# Patient Record
Sex: Female | Born: 2005 | Race: White | Hispanic: Yes | Marital: Single | Smoking: Never smoker
Health system: Southern US, Community
[De-identification: ages and names within clinical notes are randomized; demographics above are authoritative.]

---
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
## 2023-04-01 ENCOUNTER — Other Ambulatory Visit: Payer: Self-pay | Admitting: Physician Assistant

## 2023-04-01 DIAGNOSIS — N6311 Unspecified lump in the right breast, upper outer quadrant: Secondary | ICD-10-CM

## 2023-04-16 ENCOUNTER — Ambulatory Visit
Admission: RE | Admit: 2023-04-16 | Discharge: 2023-04-16 | Disposition: A | Discharge status: Home / Self Care | Payer: Medicaid Other | Source: Ambulatory Visit | Attending: Physician Assistant | Admitting: Physician Assistant

## 2023-04-16 DIAGNOSIS — N6311 Unspecified lump in the right breast, upper outer quadrant: Secondary | ICD-10-CM

## 2024-08-07 ENCOUNTER — Ambulatory Visit (HOSPITAL_COMMUNITY): Admission: EM | Admit: 2024-08-07 | Discharge: 2024-08-07 | Discharge status: Against Medical Advice

## 2024-08-10 ENCOUNTER — Ambulatory Visit (HOSPITAL_COMMUNITY)
Admission: EM | Admit: 2024-08-10 | Discharge: 2024-08-10 | Disposition: A | Discharge status: Home / Self Care | Attending: Emergency Medicine | Admitting: Emergency Medicine

## 2024-08-10 ENCOUNTER — Encounter (HOSPITAL_COMMUNITY): Payer: Self-pay | Admitting: Emergency Medicine

## 2024-08-10 ENCOUNTER — Ambulatory Visit (HOSPITAL_COMMUNITY): Payer: Self-pay

## 2024-08-10 DIAGNOSIS — R300 Dysuria: Secondary | ICD-10-CM | POA: Insufficient documentation

## 2024-08-10 DIAGNOSIS — Z3202 Encounter for pregnancy test, result negative: Secondary | ICD-10-CM | POA: Diagnosis not present

## 2024-08-10 DIAGNOSIS — N898 Other specified noninflammatory disorders of vagina: Secondary | ICD-10-CM | POA: Diagnosis not present

## 2024-08-10 LAB — POCT URINALYSIS DIP (MANUAL ENTRY)
Bilirubin, UA: NEGATIVE
Blood, UA: NEGATIVE
Glucose, UA: NEGATIVE mg/dL
Leukocytes, UA: NEGATIVE
Nitrite, UA: NEGATIVE
Protein Ur, POC: NEGATIVE mg/dL
Spec Grav, UA: 1.025 (ref 1.010–1.025)
Urobilinogen, UA: 0.2 U/dL
pH, UA: 7 (ref 5.0–8.0)

## 2024-08-10 LAB — POCT URINE PREGNANCY: Preg Test, Ur: NEGATIVE

## 2024-08-10 MED ORDER — FLUCONAZOLE 150 MG PO TABS: ORAL_TABLET | ORAL | 0 refills | Status: DC

## 2024-08-10 NOTE — ED Triage Notes (Signed)
 Pt c/o dysuria for 4 days. Reports vaginal itching as well. Denies any blood in urine.

## 2024-08-10 NOTE — Discharge Instructions (Signed)
 Take 1 tablet of Diflucan  today and another tablet in 3 days if your symptoms continue to cover for possible yeast infection. Your swab and urine culture results will return over the next few days and someone will call if results are positive and require any additional treatment. Otherwise follow-up with your primary care provider or return here as needed.

## 2024-08-10 NOTE — ED Provider Notes (Signed)
 [facility]    CSN: [id number] Arrival date & time: 08/10/24  1420      History   Chief Complaint Chief Complaint  Patient presents with   appt 230p    HPI Connie Mendoza is a 18 y.o. female.   Patient presents with dysuria that she describes as vaginal discomfort with urinating.  Patient reports that she is also had some vaginal itching.  Denies any abnormal vaginal discharge, abnormal vaginal bleeding, or vaginal lesions.  Denies hematuria, urinary frequency/urgency, abdominal pain, flank pain, and fever.  LMP 8/2.  Denies any known exposures to STDs.  The history is provided by the patient and medical records.    History reviewed. No pertinent past medical history.  Patient Active Problem List   Diagnosis Date Noted   Ingrown toenail 11/20/2019    History reviewed. No pertinent surgical history.  OB History     Gravida  0   Para      Term      Preterm      AB      Living         SAB      IAB      Ectopic      Multiple      Live Births               Home Medications    Prior to Admission medications   Medication Sig Start Date End Date Taking? Authorizing Provider  fluconazole  (DIFLUCAN ) 150 MG tablet Take one tablet today and one tablet in 3 days if symptoms persist. 08/10/24  Yes Johnie Flaming A, NP  amoxicillin  (AMOXIL ) 250 MG/5ML suspension Take 15 mLs (750 mg total) by mouth 2 (two) times daily. 750mg  po bid x 10 days qs Patient not taking: Reported on 01/07/2018 05/03/15   Rhae Lye, MD  ibuprofen  (ADVIL ,MOTRIN ) 100 MG/5ML suspension Take 5 mg/kg by mouth every 6 (six) hours as needed.    [provider]  Multiple Vitamin (MULTIVITAMIN) capsule Take 1 capsule by mouth daily.    [provider]    Family History No family history on file.  Social History Social History   Tobacco Use   Smoking status: Never   Smokeless tobacco: Never  Substance Use Topics   Alcohol use: No      Allergies   Patient has no known allergies.   Review of Systems Review of Systems  Per HPI  Physical Exam Triage Vital Signs ED Triage Vitals  Encounter Vitals Group     BP 08/10/24 1433 102/67     Girls Systolic BP Percentile --      Girls Diastolic BP Percentile --      Boys Systolic BP Percentile --      Boys Diastolic BP Percentile --      Pulse Rate 08/10/24 1433 77     Resp 08/10/24 1433 16     Temp 08/10/24 1433 98.4 F (36.9 C)     Temp Source 08/10/24 1433 Oral     SpO2 08/10/24 1433 99 %     Weight --      Height --      Head Circumference --      Peak Flow --      Pain Score 08/10/24 1432 0     Pain Loc --      Pain Education --      Exclude from Growth Chart --    No data found.  Updated Vital Signs  BP 102/67 (BP Location: Left Arm)   Pulse 77   Temp 98.4 F (36.9 C) (Oral)   Resp 16   LMP 07/18/2024 (Exact Date)   SpO2 99%   Visual Acuity Right Eye Distance:   Left Eye Distance:   Bilateral Distance:    Right Eye Near:   Left Eye Near:    Bilateral Near:     Physical Exam Vitals and nursing note reviewed.  Constitutional:      General: She is awake. She is not in acute distress.    Appearance: Normal appearance. She is well-developed and well-groomed. She is not ill-appearing.  Abdominal:     General: Abdomen is flat. Bowel sounds are normal. There is no distension.     Palpations: Abdomen is soft.     Tenderness: There is no abdominal tenderness. There is no right CVA tenderness, left CVA tenderness, guarding or rebound.  Genitourinary:    Comments: Exam deferred Skin:    General: Skin is warm and dry.  Neurological:     Mental Status: She is alert.  Psychiatric:        Behavior: Behavior is cooperative.      UC Treatments / Results  Labs (all labs ordered are listed, but only abnormal results are displayed) Labs Reviewed  POCT URINALYSIS DIP (MANUAL ENTRY) - Abnormal; Notable for the following components:       Result Value   Ketones, POC UA trace (5) (*)    All other components within normal limits  URINE CULTURE  POCT URINE PREGNANCY  CERVICOVAGINAL ANCILLARY ONLY    EKG   Radiology No results found.  Procedures Procedures (including critical care time)  Medications Ordered in UC Medications - No data to display  Initial Impression / Assessment and Plan / UC Course  I have reviewed the triage vital signs and the nursing notes.  Pertinent labs & imaging results that were available during my care of the patient were reviewed by me and considered in my medical decision making (see chart for details).     Patient is overall well-appearing.  Vitals are stable.  GU exam deferred.  Patient perform self swab for STD/STI.  HIV and RPR declined.  Urinalysis unremarkable, will send urine culture to confirm.  Urine pregnancy negative.  Prescribed Diflucan  for empirical treatment of yeast vaginitis due to vaginal itching.  Discussed follow-up and return precautions. Final Clinical Impressions(s) / UC Diagnoses   Final diagnoses:  Dysuria  Vaginal itching     Discharge Instructions      Take 1 tablet of Diflucan  today and another tablet in 3 days if your symptoms continue to cover for possible yeast infection. Your swab and urine culture results will return over the next few days and someone will call if results are positive and require any additional treatment. Otherwise follow-up with your primary care provider or return here as needed.   ED Prescriptions     Medication Sig Dispense Auth. Provider   fluconazole  (DIFLUCAN ) 150 MG tablet Take one tablet today and one tablet in 3 days if symptoms persist. 2 tablet Johnie Flaming A, NP      PDMP not reviewed this encounter.   Johnie Flaming A, NP 08/10/24 1453

## 2024-08-11 ENCOUNTER — Ambulatory Visit (HOSPITAL_COMMUNITY): Payer: Self-pay

## 2024-08-11 LAB — URINE CULTURE: Culture: NO GROWTH

## 2024-08-11 LAB — CERVICOVAGINAL ANCILLARY ONLY
Bacterial Vaginitis (gardnerella): POSITIVE — AB
Candida Glabrata: NEGATIVE
Candida Vaginitis: NEGATIVE
Chlamydia: NEGATIVE
Comment: NEGATIVE
Comment: NEGATIVE
Comment: NEGATIVE
Comment: NEGATIVE
Comment: NEGATIVE
Comment: NORMAL
Neisseria Gonorrhea: NEGATIVE
Trichomonas: NEGATIVE

## 2024-08-11 MED ORDER — METRONIDAZOLE 500 MG PO TABS: 500.0000 mg | ORAL_TABLET | Freq: Two times a day (BID) | ORAL | 0 refills | Status: DC

## 2024-08-26 NOTE — Progress Notes (Signed)
 Subjective  HPI  Here for acute care with c/o rash.  Chief Complaint  Patient presents with  . Rash    Patient reports that rash on lower abdomen and upper back. The rash has now started at the area of her arm meeting her torso. Has been using Eucerin cream but has not noticed a difference wince using. Patient stated this rash occurred while on vacation 2 weeks ago when her and her family were staying at a [facility] [place] [facility]. No pain reports just itching   Is the rash still there: yes. Rash : started 2 weeks ago. This is 3rd week. Initial rash: a lot pimple like lesions on the abdomen: looked like pimple. Red spots and itchy.  The upper back broke out: few days after. The rash has gotten worse: starting to get them on at the junction of chest/shoulder: this came on: last week. Also has 1-2 spots on thighs: this came on: last week. Applied Aveno Cream. Not applied any Eucerin. No OTC HC.  Family and her were staying at an [facility] when rash appeared. Not taken any Benadryl either. No one else in the family has the same issues. Sister who slept on the same bed: has no issues.  No known allergies to anything. Does not recall eating anything that brought on the rash. Wonders if the laundry detergent that the sheets were cleaned out.  No trouble breathing. No fevers  No regular medicines but then notes Vitamins: MVT: same one the has taken for a long time.  Patient Active Problem List   Diagnosis Date Noted  . Iron deficiency 03/30/2024  . Headache 09/14/2012    Past Medical History:  Diagnosis Date  . 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  Tobacco History   Tobacco Use  Smoking Status Never  Smokeless Tobacco Never   Social History   Substance and Sexual Activity  Alcohol Use Never   Social History   Substance and Sexual Activity  Drug Use Never   Social History   Substance and Sexual Activity  Sexual Activity Never     03/29/2023  1:44  PM  What is your living situation today?  I have a steady place to live  Think about the place you live. Do you have problems with any of the following?  None of the above  Number of positive responses to housing questions 0  Within the past 12 months, you worried that your food would run out before you got money to buy more. Never true  Within the past 12 months, the food you bought just didn't last and you didn't have money to get more. Never true  Number of positive responses to food security questions 0  In the past 12 months, has lack of transportation kept you from medical appointments, meetings, work or from getting things needed for daily living? No  In the past 12 months has the electric, gas, oil, or water company threatened to shut off services in your home? No    No family history on file.   Review of Systems  Objective   Vitals:   08/26/24 1514  BP: 101/64  BP Site: Right Arm  BP Position: Sitting  BP Cuff Size: Regular Adult  Pulse: 78  Temp: 97.7 F (36.5 C)  TempSrc: Oral  Weight: 118 lb 9.6 oz (53.8 kg)  Height: 5' 1 (1.549 m)   Estimated body mass index is 22.41 kg/m as calculated from  the following:   Height as of this encounter: 5' 1 (1.549 m).   Weight as of this encounter: 118 lb 9.6 oz (53.8 kg). Facility age limit for growth %iles is 20 years.   Physical Exam  Alert, normal mentation and interaction. No visible distress. Not ill appearing Skin:  Scattered areas of skin colored follicular sized lesions on abdomen, back, close to the axilla bilaterally. Area on Rt side of abdomen: skin colored elliptical shaped, slightly raised lesions about 5 of them, somewhat linear . Not vesicular. No lip swelling.  Breathing: not labored   Assessment and Plan  1. Dermatitis (Primary) Not sure what caused it.  Perhaps an allergic reaction to something, perhaps heat. Not bed bug bites. Sister who slept in same room is fine. Pt to watch triggers:  foods/drinks/topical stuff.  IF continues to have problem and can't figure out cause: can give referral to Allergist for testing. She can apply the TAC cream if Prednisone does not solve the issue. Dont think it is a IgE mediated reaction at this time so did not do a taper over 2 weeks or so. Will see. See AVS for more advise given.  - predniSONE (DELTASONE) 20 mg tablet; Take 1 Tablet by mouth once daily With food.  Dispense: 5 Tablet; Refill: 0  - triamcinolone (KENALOG) 0.1 % cream; Apply topically 2 (two) times daily Apply only very thin smear to the affected areas twice a day for 2 weeks only and then as needed. Not for eyes or genital areas..  Dispense: 15 g; Refill: 0  Rtc prn

## 2024-11-17 ENCOUNTER — Ambulatory Visit
Admission: EM | Admit: 2024-11-17 | Discharge: 2024-11-17 | Disposition: A | Discharge status: Home / Self Care | Attending: Student | Admitting: Student

## 2024-11-17 ENCOUNTER — Encounter: Payer: Self-pay | Admitting: Emergency Medicine

## 2024-11-17 DIAGNOSIS — N760 Acute vaginitis: Secondary | ICD-10-CM | POA: Insufficient documentation

## 2024-11-17 LAB — POCT URINE DIPSTICK
Bilirubin, UA: NEGATIVE
Blood, UA: NEGATIVE
Glucose, UA: NEGATIVE mg/dL
Ketones, POC UA: NEGATIVE mg/dL
Leukocytes, UA: NEGATIVE
Nitrite, UA: NEGATIVE
POC PROTEIN,UA: NEGATIVE
Spec Grav, UA: >=1.03 — AB (ref 1.010–1.025)
Urobilinogen, UA: 0.2 U/dL
pH, UA: 6 (ref 5.0–8.0)

## 2024-11-17 LAB — POCT URINE PREGNANCY: Preg Test, Ur: NEGATIVE

## 2024-11-17 MED ORDER — FLUCONAZOLE 150 MG PO TABS: ORAL_TABLET | ORAL | 0 refills | Status: AC

## 2024-11-17 MED ORDER — METRONIDAZOLE 500 MG PO TABS: 500.0000 mg | ORAL_TABLET | Freq: Two times a day (BID) | ORAL | 0 refills | Status: AC

## 2024-11-17 NOTE — ED Triage Notes (Signed)
 Pt c/o vaginal burning when urinating and yellowish discharge for 2 days

## 2024-11-17 NOTE — ED Provider Notes (Signed)
 [facility]    CSN: [id number] Arrival date & time: 11/17/24  1240      History   Chief Complaint Chief Complaint  Patient presents with   Vaginal Discharge    HPI Connie Mendoza is a 18 y.o. female presenting with vaginitis. Pt c/o vaginal burning when urinating and yellowish discharge for 2 days. Denies abd pain, CVAT, fevers. Denies new partners. Last BV was 07/2024; she was treated with metronidazole  and flagyl  at that time, which she completed as directed.  HPI  History reviewed. No pertinent past medical history.  Patient Active Problem List   Diagnosis Date Noted   Ingrown toenail 11/20/2019    History reviewed. No pertinent surgical history.  OB History     Gravida  0   Para      Term      Preterm      AB      Living         SAB      IAB      Ectopic      Multiple      Live Births               Home Medications    Prior to Admission medications   Medication Sig Start Date End Date Taking? Authorizing Provider  fluconazole  (DIFLUCAN ) 150 MG tablet If you develop vaginal yeast infection, take one tablet. If symptoms persist, take the second tablet 3 days later. 11/17/24   Amous Crewe E, PA-C  ibuprofen  (ADVIL ,MOTRIN ) 100 MG/5ML suspension Take 5 mg/kg by mouth every 6 (six) hours as needed.    [provider]  metroNIDAZOLE  (FLAGYL ) 500 MG tablet Take 1 tablet (500 mg total) by mouth 2 (two) times daily. 11/17/24   Gizzelle Lacomb E, PA-C  Multiple Vitamin (MULTIVITAMIN) capsule Take 1 capsule by mouth daily.    [provider]    Family History History reviewed. No pertinent family history.  Social History Social History   Tobacco Use   Smoking status: Never   Smokeless tobacco: Never  Substance Use Topics   Alcohol use: No     Allergies   Patient has no known allergies.   Review of Systems Review of Systems  Constitutional:  Negative for chills and fever.  HENT:  Negative for sore  throat.   Eyes:  Negative for pain and redness.  Respiratory:  Negative for shortness of breath.   Cardiovascular:  Negative for chest pain.  Gastrointestinal:  Negative for abdominal pain, diarrhea, nausea and vomiting.  Genitourinary:  Positive for vaginal discharge. Negative for decreased urine volume, difficulty urinating, dysuria, flank pain, frequency, genital sores, hematuria and urgency.  Musculoskeletal:  Negative for back pain.  Skin:  Negative for rash.     Physical Exam Triage Vital Signs ED Triage Vitals  Encounter Vitals Group     BP      Girls Systolic BP Percentile      Girls Diastolic BP Percentile      Boys Systolic BP Percentile      Boys Diastolic BP Percentile      Pulse      Resp      Temp      Temp src      SpO2      Weight      Height      Head Circumference      Peak Flow      Pain Score      Pain Loc  Pain Education      Exclude from Growth Chart    No data found.  Updated Vital Signs BP 107/68 (BP Location: Right Arm)   Pulse 73   Temp 98 F (36.7 C) (Oral)   Resp 16   LMP 10/19/2024 (Approximate)   SpO2 98%   Visual Acuity Right Eye Distance:   Left Eye Distance:   Bilateral Distance:    Right Eye Near:   Left Eye Near:    Bilateral Near:     Physical Exam Vitals reviewed.  Constitutional:      General: She is not in acute distress.    Appearance: Normal appearance. She is not ill-appearing.  HENT:     Head: Normocephalic and atraumatic.     Mouth/Throat:     Mouth: Mucous membranes are moist.     Comments: Moist mucous membranes Eyes:     Extraocular Movements: Extraocular movements intact.     Pupils: Pupils are equal, round, and reactive to light.  Cardiovascular:     Rate and Rhythm: Normal rate and regular rhythm.     Heart sounds: Normal heart sounds.  Pulmonary:     Effort: Pulmonary effort is normal.     Breath sounds: Normal breath sounds. No wheezing, rhonchi or rales.  Abdominal:     General: Bowel  sounds are normal. There is no distension.     Palpations: Abdomen is soft. There is no mass.     Tenderness: There is no abdominal tenderness. There is no right CVA tenderness, left CVA tenderness, guarding or rebound.  Skin:    General: Skin is warm.     Capillary Refill: Capillary refill takes less than 2 seconds.     Comments: Good skin turgor  Neurological:     General: No focal deficit present.     Mental Status: She is alert and oriented to person, place, and time.  Psychiatric:        Mood and Affect: Mood normal.        Behavior: Behavior normal.      UC Treatments / Results  Labs (all labs ordered are listed, but only abnormal results are displayed) Labs Reviewed  POCT URINE DIPSTICK - Abnormal; Notable for the following components:      Result Value   Clarity, UA hazy (*)    Spec Grav, UA >=1.030 (*)    All other components within normal limits  URINE CULTURE  POCT URINE PREGNANCY  CERVICOVAGINAL ANCILLARY ONLY    EKG   Radiology No results found.  Procedures Procedures (including critical care time)  Medications Ordered in UC Medications - No data to display  Initial Impression / Assessment and Plan / UC Course  I have reviewed the triage vital signs and the nursing notes.  Pertinent labs & imaging results that were available during my care of the patient were reviewed by me and considered in my medical decision making (see chart for details).     Patient is a pleasant 18 year old female presenting with suspected BV. The patient is afebrile and nontachycardic.  Antipyretic has not been administered today.  On exam, there is no abdominal pain.  Last BV was 07/2024; she was treated with metronidazole  and flagyl  at that time.  UA negative blood, negative leuk, negative nitrite; culture sent. Urine pregnancy negative.   Self swab collected for BV, yeast, gonorrhea, chlamydia, trichomonas.  Denies new partner in the last 3 months.  Her symptoms are  consistent with her typical BV symptoms.  Metronidazole  sent, and Diflucan  sent to have on hand.  Safe sex precautions.  For results callback, patient requests that we do NOT call mother with results.  Final Clinical Impressions(s) / UC Diagnoses   Final diagnoses:  Vaginitis and vulvovaginitis     Discharge Instructions      -We are treating for bacterial vaginosis. -For bacterial vaginosis, start the antibiotic-Flagyl  (metronidazole ), 2 pills daily for 7 days.  You can take this with food if you have a sensitive stomach.  Avoid alcohol while taking this medication and for 2 days after as this will cause severe nausea and vomiting. -If you develop vaginal yeast infection, take one tablet. If symptoms persist, take the second tablet 3 days later.  -We will call with any positive results in about 3 business days and can send treatment if necessary. -We will not call with negative lab results. -Lab results will automatically go to your MyChart.     ED Prescriptions     Medication Sig Dispense Auth. Provider   metroNIDAZOLE  (FLAGYL ) 500 MG tablet Take 1 tablet (500 mg total) by mouth 2 (two) times daily. 14 tablet Sabreen Kitchen E, PA-C   fluconazole  (DIFLUCAN ) 150 MG tablet If you develop vaginal yeast infection, take one tablet. If symptoms persist, take the second tablet 3 days later. 2 tablet Pallas Wahlert E, PA-C      PDMP not reviewed this encounter.   Arlyss Leita BRAVO, PA-C 11/17/24 1308

## 2024-11-17 NOTE — Discharge Instructions (Signed)
-  We are treating for bacterial vaginosis. -For bacterial vaginosis, start the antibiotic-Flagyl  (metronidazole ), 2 pills daily for 7 days.  You can take this with food if you have a sensitive stomach.  Avoid alcohol while taking this medication and for 2 days after as this will cause severe nausea and vomiting. -If you develop vaginal yeast infection, take one tablet. If symptoms persist, take the second tablet 3 days later.  -We will call with any positive results in about 3 business days and can send treatment if necessary. -We will not call with negative lab results. -Lab results will automatically go to your MyChart.

## 2024-11-18 ENCOUNTER — Ambulatory Visit (HOSPITAL_COMMUNITY): Payer: Self-pay

## 2024-11-18 LAB — URINE CULTURE: Culture: NO GROWTH

## 2024-11-18 LAB — CERVICOVAGINAL ANCILLARY ONLY
Bacterial Vaginitis (gardnerella): POSITIVE — AB
Candida Glabrata: NEGATIVE
Candida Vaginitis: NEGATIVE
Chlamydia: NEGATIVE
Comment: NEGATIVE
Comment: NEGATIVE
Comment: NEGATIVE
Comment: NEGATIVE
Comment: NEGATIVE
Comment: NORMAL
Neisseria Gonorrhea: NEGATIVE
Trichomonas: NEGATIVE
